# Patient Record
Sex: Male | Born: 1995 | Race: White | Marital: Single | Smoking: Never smoker
Health system: Southern US, Community
[De-identification: ages and names within clinical notes are randomized; demographics above are authoritative.]

## PROBLEM LIST (undated history)

## (undated) HISTORY — PX: FINGER SURGERY: SHX640

---

## 2013-04-17 ENCOUNTER — Emergency Department (HOSPITAL_COMMUNITY)
Admission: EM | Admit: 2013-04-17 | Discharge: 2013-04-17 | Disposition: A | Discharge status: Home / Self Care | Payer: BC Managed Care – PPO | Attending: Emergency Medicine | Admitting: Emergency Medicine

## 2013-04-17 ENCOUNTER — Emergency Department (HOSPITAL_COMMUNITY): Payer: BC Managed Care – PPO

## 2013-04-17 ENCOUNTER — Encounter (HOSPITAL_COMMUNITY): Payer: Self-pay | Admitting: Emergency Medicine

## 2013-04-17 DIAGNOSIS — IMO0002 Reserved for concepts with insufficient information to code with codable children: Secondary | ICD-10-CM | POA: Insufficient documentation

## 2013-04-17 DIAGNOSIS — Y92410 Unspecified street and highway as the place of occurrence of the external cause: Secondary | ICD-10-CM | POA: Insufficient documentation

## 2013-04-17 DIAGNOSIS — Y9389 Activity, other specified: Secondary | ICD-10-CM | POA: Insufficient documentation

## 2013-04-17 DIAGNOSIS — S060X9A Concussion with loss of consciousness of unspecified duration, initial encounter: Secondary | ICD-10-CM | POA: Diagnosis not present

## 2013-04-17 DIAGNOSIS — T07XXXA Unspecified multiple injuries, initial encounter: Secondary | ICD-10-CM

## 2013-04-17 DIAGNOSIS — S060XAA Concussion with loss of consciousness status unknown, initial encounter: Secondary | ICD-10-CM

## 2013-04-17 DIAGNOSIS — S0990XA Unspecified injury of head, initial encounter: Secondary | ICD-10-CM | POA: Diagnosis present

## 2013-04-17 NOTE — ED Notes (Signed)
[facility]
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
## 2013-04-17 NOTE — ED Notes (Signed)
Pt alert, NAD, calm, interactive, resps e/u, speaking in clear complete sentences, no dyspnea noted, no obvious deformities, LS CTA, MAEx4, abd soft NT, follows commands, answers questions appropriately, mentions HA. C/o bilateral hand and knee abrasion pain (stinging). (denies: dizziness, sob or nausea). [facility] x[facility] at [employer]. [place] is [employer] [place]

## 2013-04-17 NOTE — ED Notes (Addendum)
Wounds cleansed with sure cleanse and saline. Dr. Christain SacramentoBeaver in to see.

## 2013-04-17 NOTE — Discharge Instructions (Signed)
Concussion, Adult  A concussion is a brain injury. It is caused by:  · A hit to the head.  · A quick and sudden movement (jolt) of the head or neck.  A concussion is usually not life-threatening. Even so, it can cause serious problems. If you had a concussion before, you may have concussion-like problems after a hit to your head.  HOME CARE  General Instructions  · Follow your doctor's directions carefully.  · Take medicines only as told by your doctor.  · Only take medicines your doctor says are safe.  · Do not drink alcohol until your doctor says it is OK. Alcohol and some drugs can slow down healing. They can also put you at risk for further injury.  · If your are having trouble remembering things, write them down.  · Try to do one thing at a time if you get distracted easily. For example, do not watch TV while making dinner.  · Talk to your family members or close friends when making important decisions.  · Follow up with your doctor as told.  · Watch your symptoms. Tell others to do the same. Serious problems can sometimes happen after a concussion. Older adults are more likely to have these problems.  · Tell your teachers, school nurse, school counselor, coach, athletic trainer, or work manager about your concussion. Tell them about what you can or cannot do. They should watch to see if:  · It gets even harder for you to pay attention or concentrate.  · It gets even harder for you to remember things or learn new things.  · You need more time than normal to finish things.  · You become annoyed (irritable) more than before.  · You are not able to deal with stress as well.  · You have more problems than before.  · Rest. Make sure you:  · Get plenty of sleep at night.  · Go to sleep early.  · Go to bed at the same time every day. Try to wake up at the same time.  · Rest during the day.  · Take naps when you feel tired.  · Limit activities where you have to think a lot or concentrate. These include:  · Doing  homework.  · Doing work related to a job.  · Watching TV.  · Using the computer.  Returning To Your Regular Activities  Return to your normal activities slowly, not all at once. You must give your body and brain enough time to heal.   · Do not play sports or do other athletic activities until your doctor says it is OK.  · Ask your doctor when you can drive, ride a bicycle, or work other vehicles or machines. Never do these things if you feel dizzy.  · Ask your doctor about when you can return to work or school.  Preventing Another Concussion  It is very important to avoid another brain injury, especially before you have healed. In rare cases, another injury can lead to permanent brain damage, brain swelling, or death. The risk of this is greatest during the first 7 10 after your injury. Avoid injuries by:   · Wearing a seat belt when riding in a car.  · Not drinking too much alcohol.  · Avoiding activities that could lead to a second concussion (such as contact sports).  · Wearing a helmet when doing activities like:  · Biking.  · Skiing.  · Skateboarding.  · Skating.  ·   Making your home safer by:  · Removing things from the floor or stairways that could make you trip.  · Using grab bars in bathrooms and handrails by stairs.  · Placing non-slip mats on floors and in bathtubs.  · Improve lighting in dark areas.  GET HELP IF:  · It gets even harder for you to pay attention or concentrate.  · It gets even harder for you to remember things or learn new things.  · You need more time than normal to finish things.  · You become annoyed (irritable) more than before.  · You are not able to deal with stress as well.  · You have more problems than before.  · You have problems keeping your balance.  · You are not able to react quickly when you should.  Get help if you have any of these problems for more than 2 weeks:   · Lasting (chronic) headaches.  · Dizziness or trouble balancing.  · Feeling sick to your stomach  (nausea).  · Seeing (vision) problems.  · Being affected by noises or light more than normal.  · Feeling sad, low, down in the dumps, blue, gloomy, or empty (depressed).  · Mood changes (mood swings).  · Feeling of fear or nervousness about what may happen (anxiety).  · Feeling annoyed.  · Memory problems.  · Problems concentrating or paying attention.  · Sleep problems.  · Feeling tired all the time.  GET HELP RIGHT AWAY IF:   · You have bad headaches or your headaches get worse.  · You have weakness (even if it is in one hand, leg, or part of the face).  · You have loss of feeling (numbness).  · You feel off balance.  · You keep throwing up (vomiting).  · You feel tired.  · One black center of your eye (pupil) is larger than the other.  · You twitch or shake violently (convulse).  · Your speech is not clear (slurred).  · You are more confused, easily angered (agitated), or annoyed than before.  · You have more trouble resting than before.  · You are unable to recognize people or places.  · You have neck pain.  · It is difficult to wake you up.  · You have unusual behavior changes.  · You pass out (lose consciousness).  MAKE SURE YOU:   · Understand these instructions.  · Will watch your condition.  · Will get help right away if you are not doing well or get worse.  Document Released: 12/10/2008 Document Revised: 10/12/2012 Document Reviewed: 07/14/2012  ExitCare® Patient Information ©2014 [employer].

## 2013-04-17 NOTE — ED Notes (Signed)
Pt in CT.
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
## 2013-04-17 NOTE — ED Notes (Signed)
Back from CT, HOB 30 degrees, c-collar remains, [facility], no changes, alert, NAD, calm, interactive.
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
